# Patient Record
Sex: Female | Born: 1985 | Race: White | Hispanic: No | Marital: Married
Health system: Southern US, Community
[De-identification: ages and names within clinical notes are randomized; demographics above are authoritative.]

---

## 2014-03-23 ENCOUNTER — Other Ambulatory Visit (HOSPITAL_COMMUNITY): Payer: Self-pay | Admitting: *Deleted

## 2014-03-23 DIAGNOSIS — O43129 Velamentous insertion of umbilical cord, unspecified trimester: Secondary | ICD-10-CM

## 2014-03-25 ENCOUNTER — Ambulatory Visit (HOSPITAL_COMMUNITY)
Admission: RE | Admit: 2014-03-25 | Discharge: 2014-03-25 | Disposition: A | Discharge status: Home / Self Care | Payer: BC Managed Care – PPO | Source: Ambulatory Visit | Attending: *Deleted | Admitting: *Deleted

## 2014-03-25 ENCOUNTER — Encounter (HOSPITAL_COMMUNITY): Payer: Self-pay

## 2014-03-25 DIAGNOSIS — O358XX0 Maternal care for other (suspected) fetal abnormality and damage, not applicable or unspecified: Secondary | ICD-10-CM | POA: Insufficient documentation

## 2014-03-25 DIAGNOSIS — IMO0001 Reserved for inherently not codable concepts without codable children: Secondary | ICD-10-CM | POA: Insufficient documentation

## 2014-03-25 DIAGNOSIS — Z1389 Encounter for screening for other disorder: Secondary | ICD-10-CM | POA: Insufficient documentation

## 2014-03-25 DIAGNOSIS — Z363 Encounter for antenatal screening for malformations: Secondary | ICD-10-CM | POA: Insufficient documentation

## 2014-03-25 DIAGNOSIS — O43129 Velamentous insertion of umbilical cord, unspecified trimester: Secondary | ICD-10-CM

## 2014-11-01 ENCOUNTER — Encounter (HOSPITAL_COMMUNITY): Payer: Self-pay

## 2015-01-28 ENCOUNTER — Encounter (HOSPITAL_COMMUNITY): Payer: Self-pay | Admitting: *Deleted

## 2015-07-17 IMAGING — US US OB DETAIL+14 WK
1 series · 12 of 28 positions shown · non-contrast
Comparison: none

[Series 1: us ob detail+14 wk · 0.18mm/px · 12 of 115 slices shown]
[im 5/115]
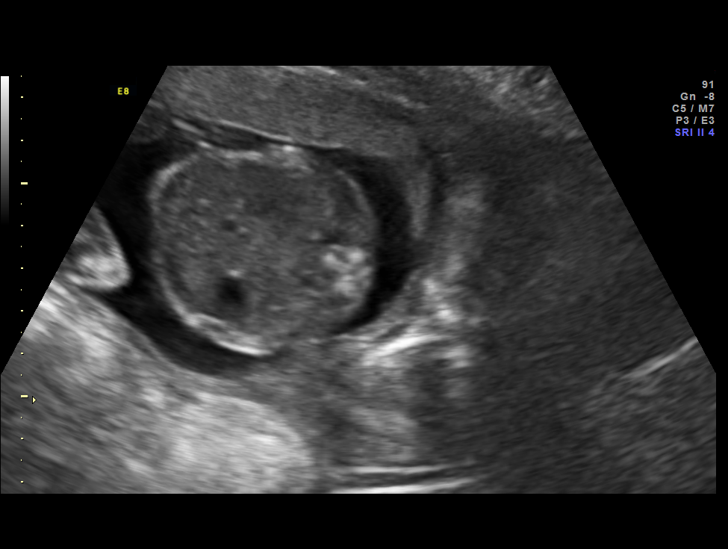
[im 13/115]
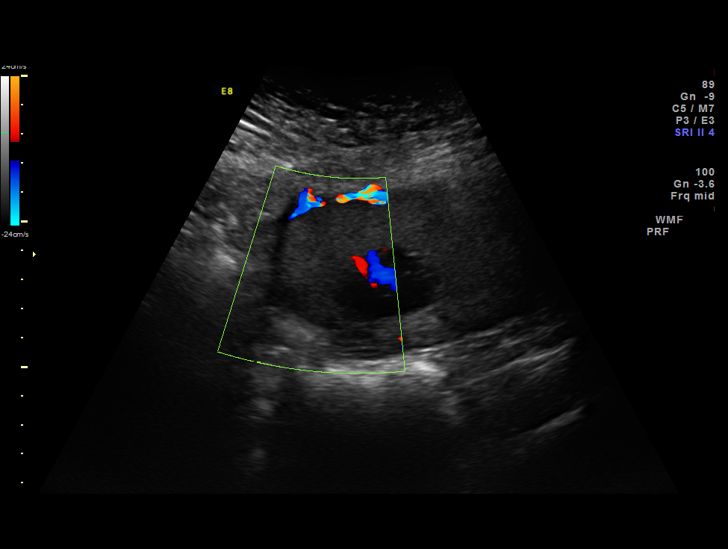
[im 22/115]
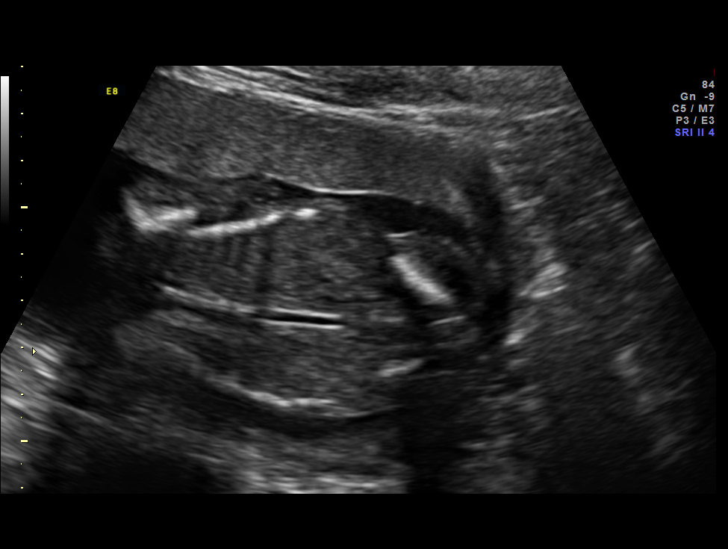
[im 34/115]
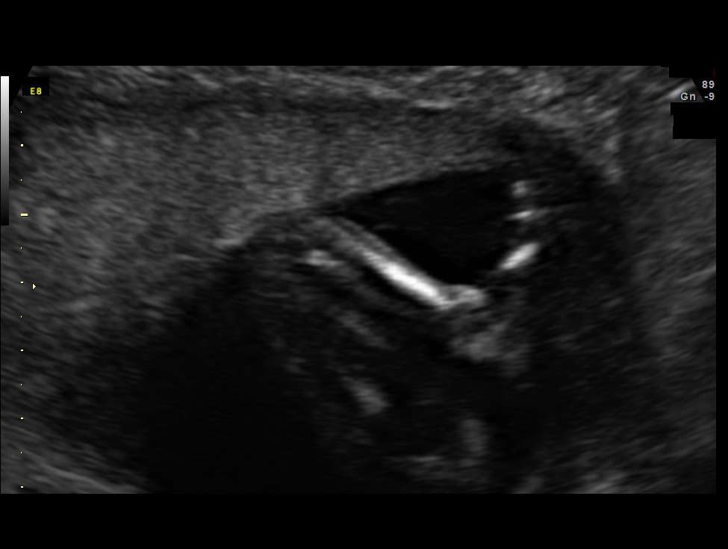
[im 43/115]
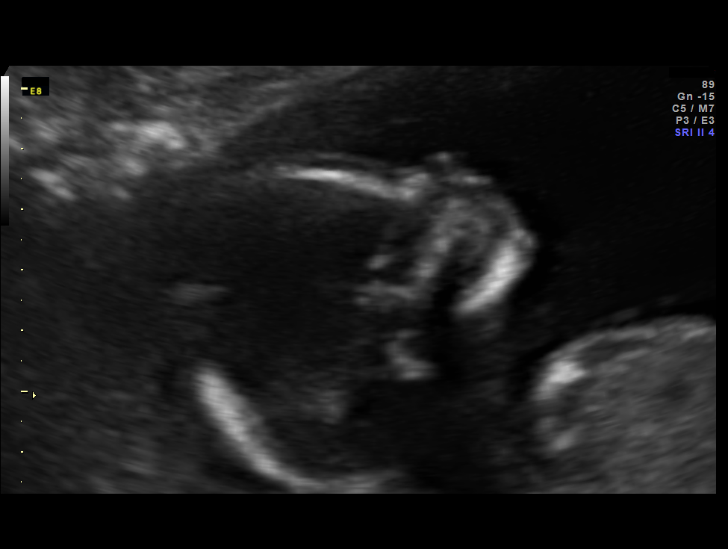
[im 51/115]
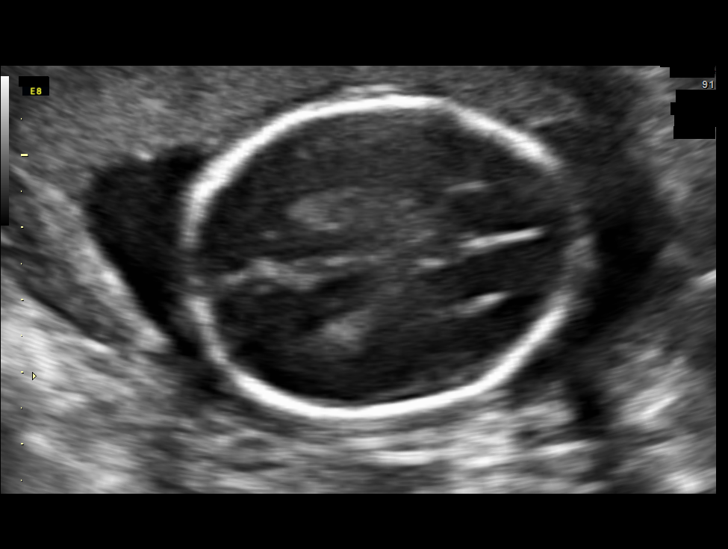
[im 64/115]
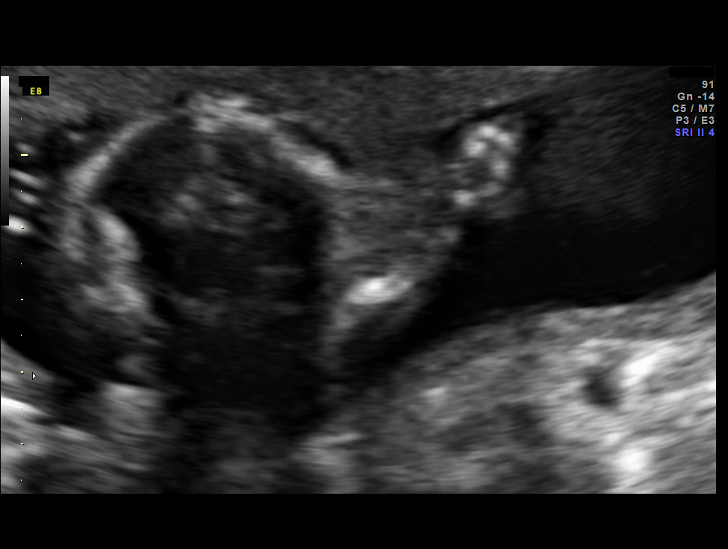
[im 72/115]
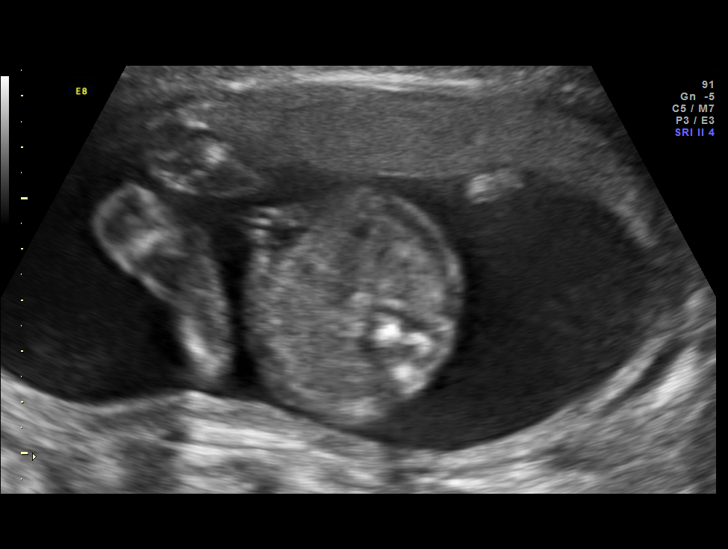
[im 81/115]
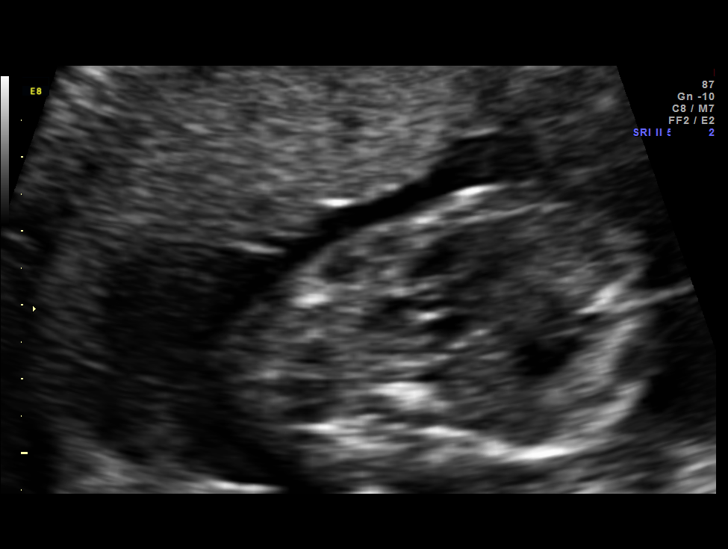
[im 93/115]
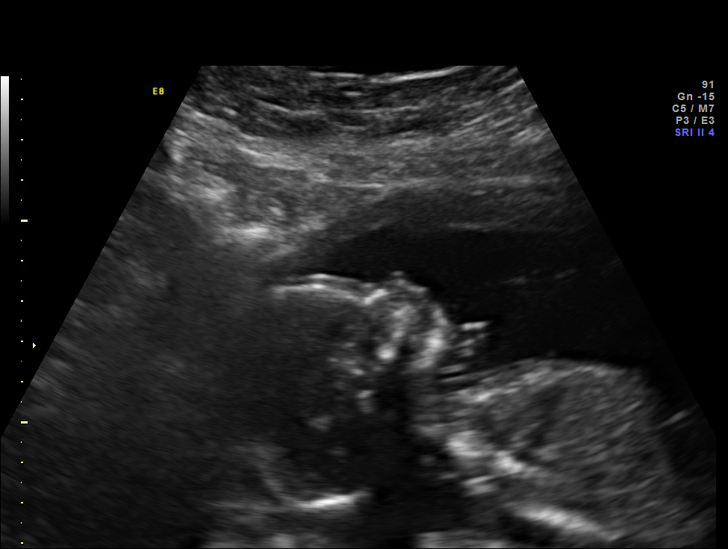
[im 102/115]
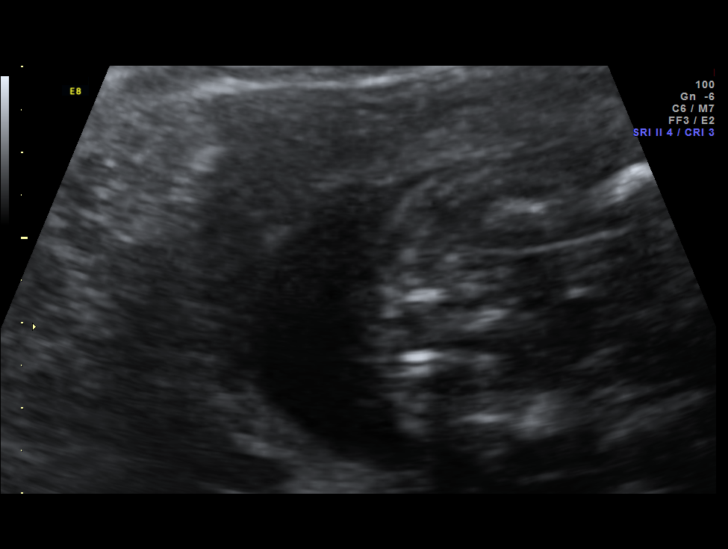
[im 110/115]
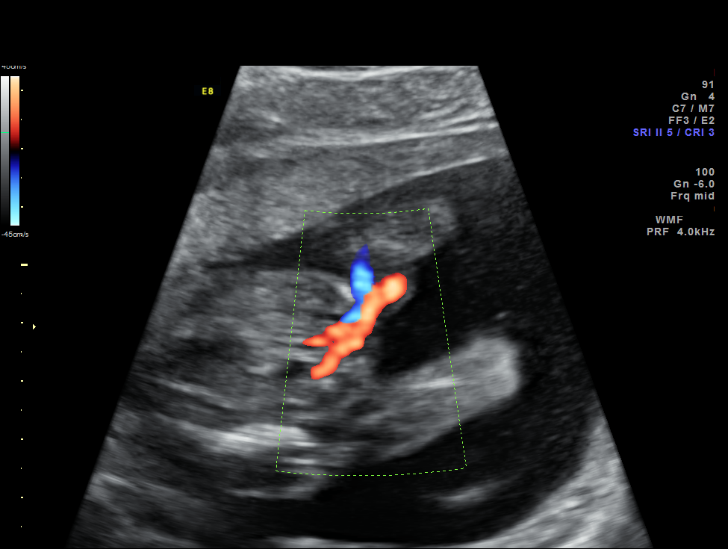

[12 of 28 positions shown; findings below may reference images not displayed]

OBSTETRICS REPORT

Service(s) Provided

 US OB DETAIL + 14 WK                                  76811.0
Indications

 Detailed fetal anatomic survey
 Other umbilical cord complications,antepartum
 condition or complication
Fetal Evaluation

 Num Of Fetuses:    1
 Fetal Heart Rate:  159                          bpm
 Cardiac Activity:  Observed
 Presentation:      Breech
 Placenta:          Anterior, above cervical os
 P. Cord            Visualized
 Insertion:

 Amniotic Fluid
 AFI FV:      Subjectively within normal limits
                                             Larg Pckt:     4.9  cm
Biometry

 BPD:     43.7  mm     G. Age:  19w 1d                CI:         73.6   70 - 86
 OFD:     59.4  mm                                    FL/HC:      17.6   16.8 -

 HC:     164.6  mm     G. Age:  19w 1d       21  %    HC/AC:      1.06   1.09 -

 AC:     155.7  mm     G. Age:  20w 5d       77  %    FL/BPD:
 FL:        29  mm     G. Age:  18w 6d       18  %    FL/AC:      18.6   20 - 24
 HUM:     28.3  mm     G. Age:  19w 1d       37  %
 CER:     19.8  mm     G. Age:  18w 6d       32  %
 NFT:      3.8  mm

 Est. FW:     314  gm    0 lb 11 oz      50  %
Gestational Age

 LMP:           19w 5d        Date:  11/07/13                 EDD:   08/14/14
 U/S Today:     19w 3d                                        EDD:   08/16/14
 Best:          19w 5d     Det. By:  LMP  (11/07/13)          EDD:   08/14/14
Anatomy
 Cranium:          Appears normal         Aortic Arch:      Appears normal
 Fetal Cavum:      Appears normal         Ductal Arch:      Appears normal
 Ventricles:       Appears normal         Diaphragm:        Appears normal
 Choroid Plexus:   Appears normal         Stomach:          Appears normal, left
                                                            sided
 Cerebellum:       Appears normal         Abdomen:          Appears normal
 Posterior Fossa:  Appears normal         Abdominal Wall:   Appears nml (cord
                                                            insert, abd wall)
 Nuchal Fold:      Appears normal         Cord Vessels:     Appears normal (3
                                                            vessel cord)
 Face:             Appears normal         Kidneys:          Appear normal
                   (orbits and profile)
 Lips:             Appears normal         Bladder:          Appears normal
 Palate:           Appears normal         Spine:            Appears normal
 Heart:            Appears normal         Lower             Appears normal
                   (4CH, axis, and        Extremities:
                   situs)
 RVOT:             Appears normal         Upper             Appears normal
                                          Extremities:
 LVOT:             Appears normal

 Other:  Fetus appears to be a female. Heels appears normal. Rt 5th digit
         appears normal.
Targeted Anatomy

 Fetal Central Nervous System
 Cisterna Magna:
Cervix Uterus Adnexa

 Cervical Length:    3.5      cm

 Cervix:       Normal appearance by transabdominal scan. Appears
               closed, without funnelling.

 Left Ovary:    Within normal limits.
 Right Ovary:   Not visualized. No adnexal mass visualized.
Impression

 SIUP at 19+5 weeks
 Normal detailed fetal anatomy; all heart views seen and
 appeared normal
 Markers of aneuploidy: none
 Normal amniotic fluid volume
 Measurements consistent with LMP dating
 Placental insertion site was marginal and not velamentous
Recommendations

 Follow-up as clinically indicated (no f/u needed for marginal
 insertion site)

 Thank you for the referral!!!!

 questions or concerns.
